# Patient Record
Sex: Male | Born: 1955 | Race: White | Hispanic: No | Marital: Single
Health system: Southern US, Community
[De-identification: ages and names within clinical notes are randomized; demographics above are authoritative.]

---

## 2003-10-23 ENCOUNTER — Emergency Department (HOSPITAL_COMMUNITY): Admission: EM | Admit: 2003-10-23 | Discharge: 2003-10-23 | Payer: Self-pay | Admitting: Emergency Medicine

## 2008-10-07 ENCOUNTER — Ambulatory Visit (HOSPITAL_COMMUNITY): Admission: RE | Admit: 2008-10-07 | Discharge: 2008-10-07 | Payer: Self-pay | Admitting: *Deleted

## 2009-12-08 ENCOUNTER — Encounter: Admission: RE | Admit: 2009-12-08 | Discharge: 2009-12-08 | Payer: Self-pay | Admitting: Family Medicine

## 2011-01-10 NOTE — Op Note (Signed)
NAME:  Corey Daniels, Corey Daniels               ACCOUNT NO.:  [id number]   MEDICAL RECORD NO.:  [id number]          PATIENT TYPE:  AMB   LOCATION:  DAY                          FACILITY:  [facility]   PHYSICIAN:  Alfonse Ras, MD   DATE OF BIRTH:  1956-01-27   DATE OF PROCEDURE:  DATE OF DISCHARGE:                               OPERATIVE REPORT   PREOPERATIVE DIAGNOSIS:  Right inguinal hernia.   POSTOPERATIVE DIAGNOSIS:  Right inguinal hernia, indirect.   PROCEDURE:  Right inguinal hernia repair with 3 x 6 UltraPro mesh.   ANESTHESIA:  General laryngeal mask.   DESCRIPTION:  After extensive informed consent was granted with the  patient, he was taken to the operating room, placed in supine position.  Abdominal and perineal prep were undertaken.  An oblique incision was  made over the right inguinal canal and I dissected down onto the  external oblique fascia using Bovie electrocautery.  The external  oblique fascia was opened along its fibers down to the external ring.  Ilioinguinal nerve was identified and retracted laterally.  Blunt  dissection was accomplished on the inguinal ligament down to Cooper's  ligament and along transversalis fascia.  Spermatic cord was surrounded  at the pubic tubercle with a Penrose drain.  Indirect hernia sac was  dissected off the cord and reduced it into the abdominal cavity.  Primary closure at the internal ring was accomplished with interrupted  #1 Surgilon sutures approximating the inguinal ligament to the  transversalis fascia in a tension-free fashion.  Overlying the repair, I  placed a piece of 3 x 6 UltraPro mesh overlying the tubercle x 2 cm and  fixed in place with a running 2-0 Prolene suture split and brought out  lateral to the spermatic cord.  Ilioinguinal nerve was returned to a  small anatomic position.  External oblique fascia was closed with a  running 3-0 Vicryl suture.  All tissues were injected with 0.5 Marcaine.  Skin was closed with a  subcuticular 4-0 Monocryl.  Steri-Strips and  sterile dressings were applied.  The patient tolerated the procedure  well, went to PACU in good condition.      Alfonse Ras, MD  Electronically Signed     KRE/MEDQ  D:  10/07/2008  T:  10/08/2008  Job:  [id number]

## 2011-10-23 IMAGING — CR DG HUMERUS 2V *L*
3 series · 3 of 3 positions shown · non-contrast
Comparison: None.

CLINICAL DATA: Left upper arm pain, no trauma

LEFT HUMERUS - 2+ VIEW

[w humerus ap left *]
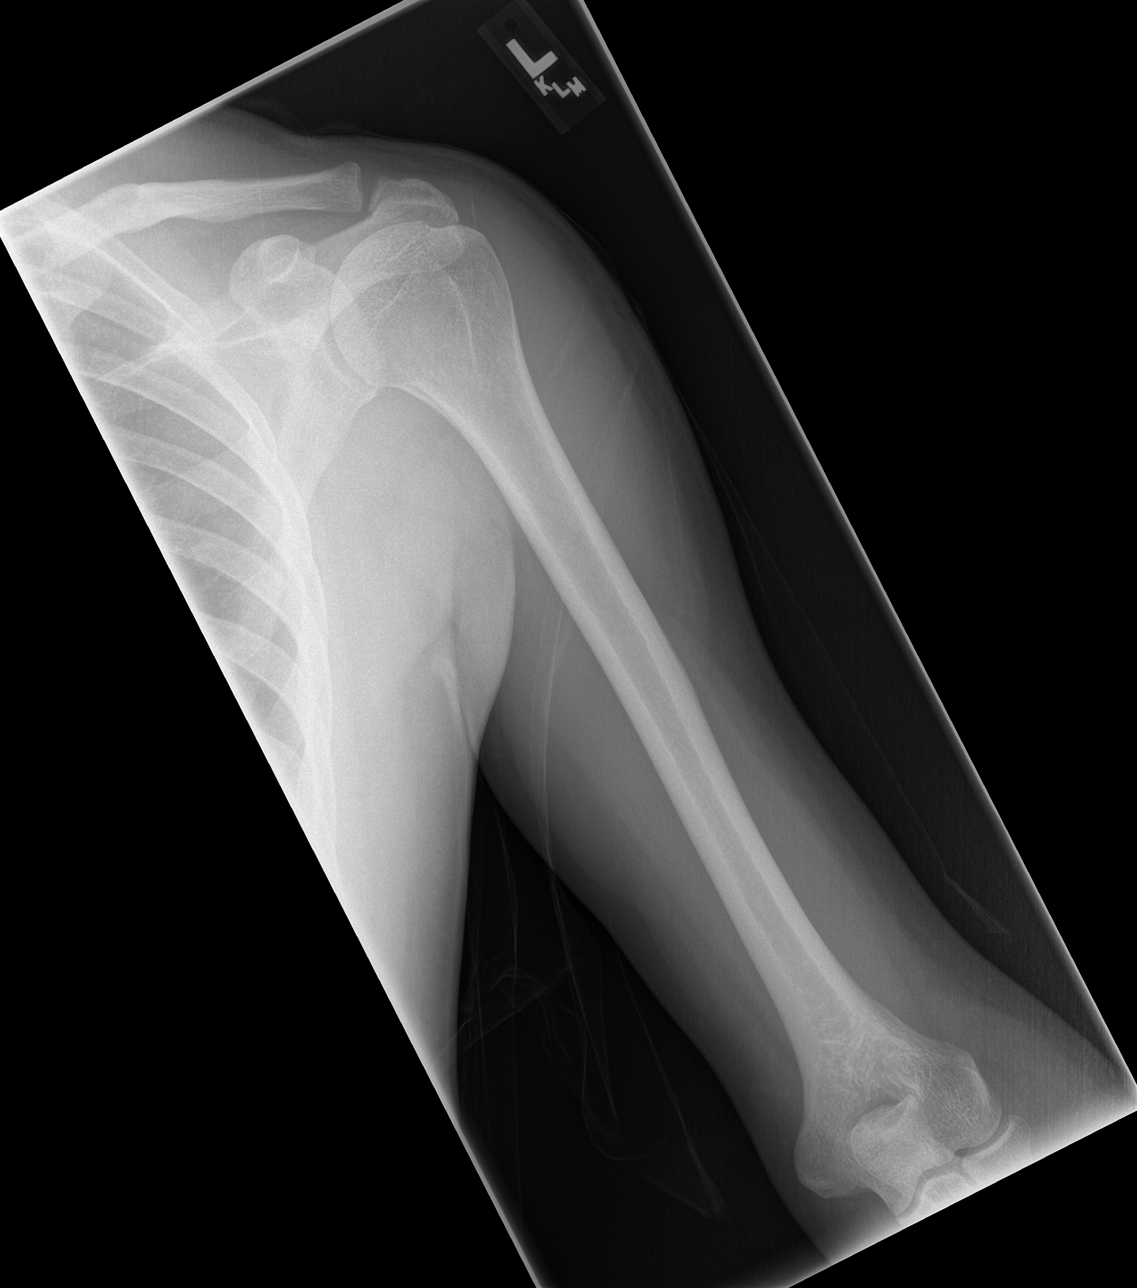

[w shoulder ap external left *]
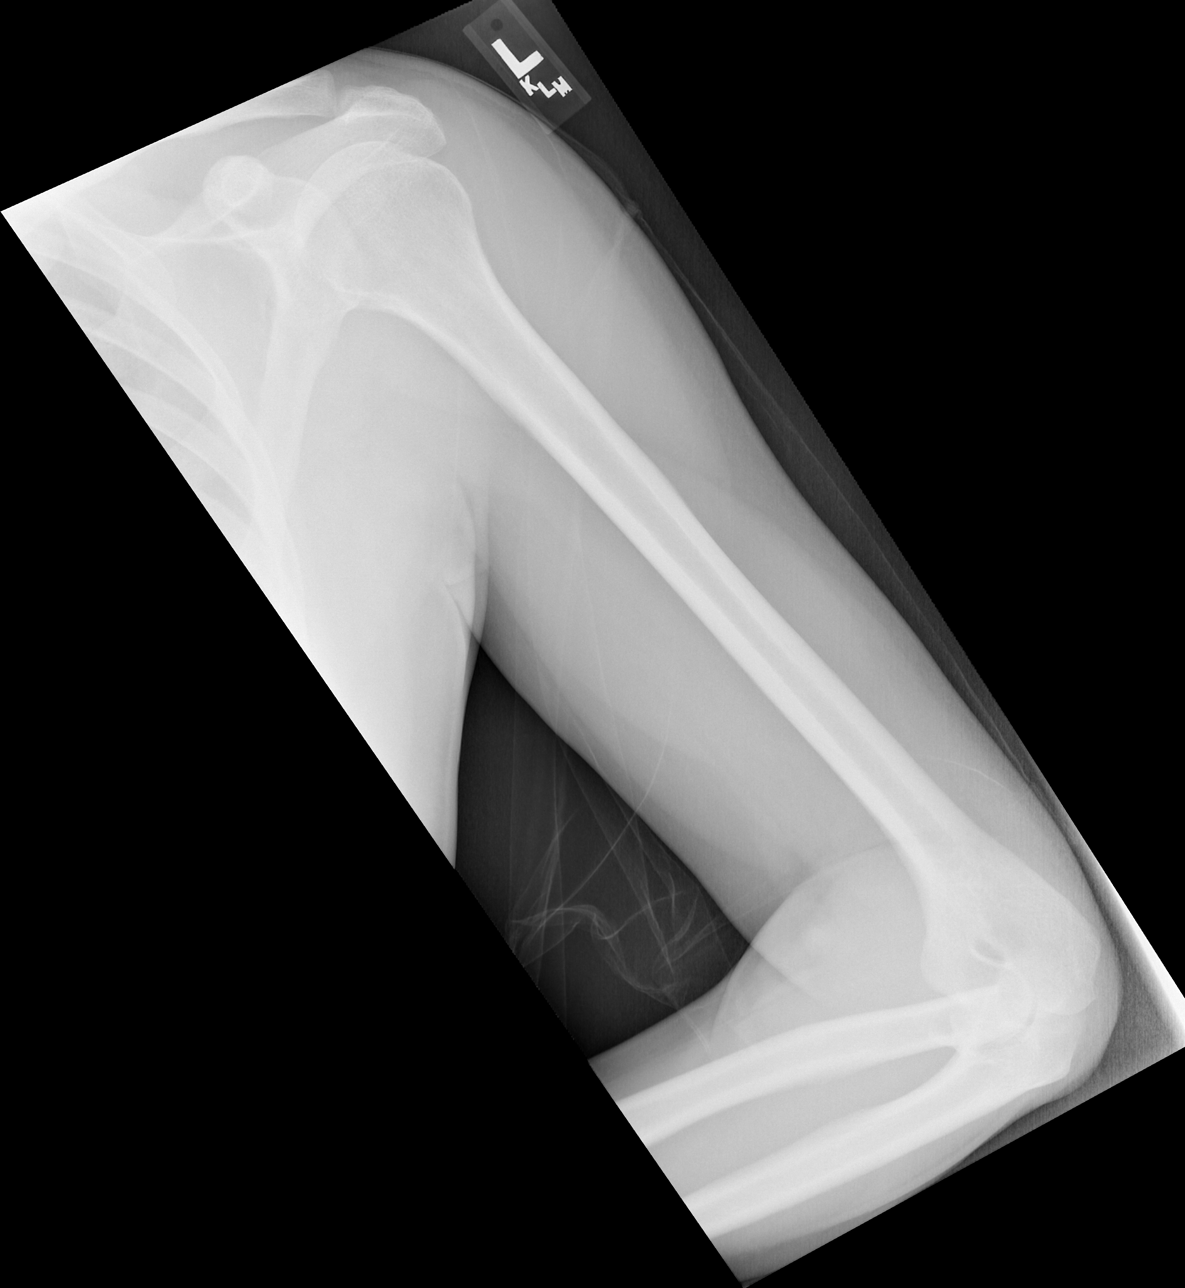

[t humerus lat left]
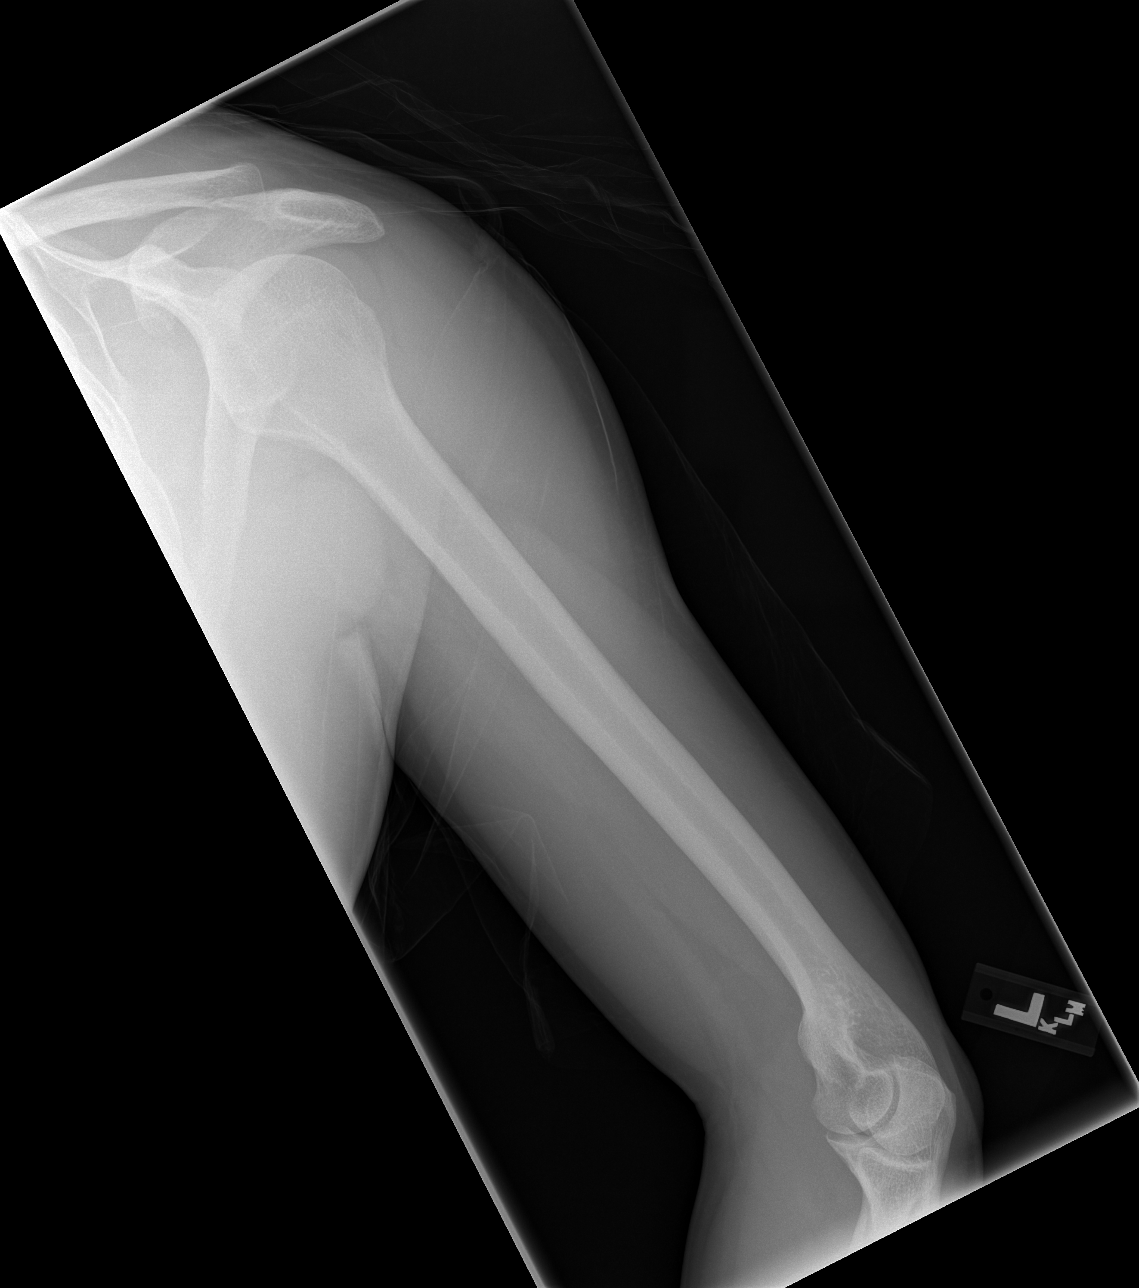

[3 of 3 positions shown; findings below may reference images not displayed]

FINDINGS: No fracture identified.  No radiopaque foreign body.  No
soft tissue abnormality.
IMPRESSION: Normal exam.

## 2018-08-23 DIAGNOSIS — C187 Malignant neoplasm of sigmoid colon: Secondary | ICD-10-CM | POA: Insufficient documentation

## 2018-09-06 DIAGNOSIS — H9193 Unspecified hearing loss, bilateral: Secondary | ICD-10-CM | POA: Insufficient documentation

## 2018-09-06 DIAGNOSIS — R7301 Impaired fasting glucose: Secondary | ICD-10-CM | POA: Insufficient documentation

## 2018-09-06 DIAGNOSIS — R972 Elevated prostate specific antigen [PSA]: Secondary | ICD-10-CM | POA: Insufficient documentation

## 2018-09-18 DIAGNOSIS — N400 Enlarged prostate without lower urinary tract symptoms: Secondary | ICD-10-CM | POA: Insufficient documentation

## 2019-10-23 DIAGNOSIS — R2240 Localized swelling, mass and lump, unspecified lower limb: Secondary | ICD-10-CM | POA: Insufficient documentation

## 2021-01-21 DIAGNOSIS — D239 Other benign neoplasm of skin, unspecified: Secondary | ICD-10-CM | POA: Insufficient documentation

## 2021-01-27 ENCOUNTER — Encounter: Payer: Self-pay | Admitting: *Deleted

## 2021-01-27 NOTE — Progress Notes (Signed)
Received referral, however scheduling delayed until 01/27/2021 due to no pathology received with initial referral. Called referring MD office and did receive path report.  Attempted to call patient twice but unable to reach. VM left x one.   Oncology Nurse Navigator Documentation  Oncology Nurse Navigator Flowsheets 01/27/2021  Abnormal Finding Date 01/04/2021  Diagnosis Status Additional Work Up  Navigator Follow Up Date: 01/31/2021  Navigator Follow Up Reason: New Patient Appointment  Navigator Location [facility]  Referral Date to RadOnc/MedOnc 01/25/2021  Navigator Encounter Type Introductory Phone Call  Patient Visit Type MedOnc  Treatment Phase Abnormal Labs  Barriers/Navigation Needs Coordination of Care;Education  Interventions Coordination of Care;Education  Acuity Level 2-Minimal Needs (1-2 Barriers Identified)  Coordination of Care Appts  Time Spent with Patient 23

## 2021-01-28 ENCOUNTER — Encounter: Payer: Self-pay | Admitting: *Deleted

## 2021-01-28 NOTE — Progress Notes (Signed)
Called and spoke with patient. The intial appointment for 01/31/2021 did not work with patient's schedule, so appointment was moved to 02/07/2021.  Reached out to Ander Gaster to introduce myself as the office RN Navigator and explain our new patient process. Reviewed the reason for their referral and scheduled their new patient appointment along with labs. Provided address and directions to the office including call back phone number. Reviewed with patient any concerns they may have or any possible barriers to attending their appointment.   Informed patient about my role as a navigator and that I will meet with them prior to their New Patient appointment and more fully discuss what services I can provide. At this time patient has no further questions or needs.   New patient packet mailed to patient  Oncology Nurse Navigator Documentation  Oncology Nurse Navigator Flowsheets 01/28/2021  Abnormal Finding Date -  Diagnosis Status -  Navigator Follow Up Date: 02/07/2021  Navigator Follow Up Reason: New Patient Appointment  Navigator Location [facility]  Referral Date to RadOnc/MedOnc -  Navigator Encounter Type Introductory Phone Call  Patient Visit Type MedOnc  Treatment Phase Abnormal Labs  Barriers/Navigation Needs Coordination of Care;Education  Education Other  Interventions Coordination of Care;Education  Acuity Level 2-Minimal Needs (1-2 Barriers Identified)  Coordination of Care Appts  Education Method Verbal  Time Spent with Patient 30

## 2021-01-31 ENCOUNTER — Ambulatory Visit: Payer: Self-pay | Admitting: Hematology & Oncology

## 2021-01-31 ENCOUNTER — Other Ambulatory Visit: Payer: Self-pay

## 2021-02-03 ENCOUNTER — Other Ambulatory Visit: Payer: Self-pay | Admitting: *Deleted

## 2021-02-03 NOTE — Progress Notes (Signed)
New pt referral, reconcile chart

## 2021-02-07 ENCOUNTER — Telehealth: Payer: Self-pay

## 2021-02-07 ENCOUNTER — Inpatient Hospital Stay: Payer: Self-pay

## 2021-02-07 ENCOUNTER — Inpatient Hospital Stay: Payer: Self-pay | Admitting: Hematology & Oncology

## 2021-02-07 NOTE — Telephone Encounter (Signed)
Returned pts call to r/s his np appt as he is out of town with his ill father   Corey Daniels

## 2021-02-08 ENCOUNTER — Encounter: Payer: Self-pay | Admitting: *Deleted

## 2021-02-08 NOTE — Progress Notes (Signed)
Patient needed to reschedule his new patient appointment due to having a sick family member.   Oncology Nurse Navigator Documentation  Oncology Nurse Navigator Flowsheets 02/08/2021  Abnormal Finding Date -  Diagnosis Status -  Navigator Follow Up Date: 02/18/2021  Navigator Follow Up Reason: New Patient Appointment  Navigator Location [facility]  Referral Date to RadOnc/MedOnc -  Navigator Encounter Type Appt/Treatment Plan Review  Patient Visit Type MedOnc  Treatment Phase Abnormal Labs  Barriers/Navigation Needs Coordination of Care;Education  Education -  Interventions None Required  Acuity Level 2-Minimal Needs (1-2 Barriers Identified)  Coordination of Care -  Education Method -  Time Spent with Patient 15

## 2021-02-18 ENCOUNTER — Inpatient Hospital Stay: Payer: 59 | Attending: Hematology & Oncology

## 2021-02-18 ENCOUNTER — Inpatient Hospital Stay: Payer: 59 | Admitting: Hematology & Oncology

## 2021-02-18 ENCOUNTER — Encounter: Payer: Self-pay | Admitting: *Deleted

## 2021-02-18 NOTE — Progress Notes (Signed)
Patient was a no-show to today's new patient appointment. He has rescheduled two prior appointments.   Dr Marin Olp has re reviewed the referral including path and has requested the referral be closed and sent back to referring physician.   Message sent to scheduler.  Oncology Nurse Navigator Documentation  Oncology Nurse Navigator Flowsheets 02/18/2021  Abnormal Finding Date -  Diagnosis Status -  Navigator Follow Up Date: -  Navigator Follow Up Reason: -  Navigation Complete Date: 02/18/2021  Post Navigation: Continue to Follow Patient? 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
